# Patient Record
Sex: Female | Born: 1980 | Race: White | Hispanic: No | Marital: Married | Smoking: Never smoker
Health system: Southern US, Community
[De-identification: ages and names within clinical notes are randomized; demographics above are authoritative.]

## PROBLEM LIST (undated history)

## (undated) ENCOUNTER — Emergency Department: Payer: 59

## (undated) DIAGNOSIS — C801 Malignant (primary) neoplasm, unspecified: Secondary | ICD-10-CM

## (undated) DIAGNOSIS — R87619 Unspecified abnormal cytological findings in specimens from cervix uteri: Secondary | ICD-10-CM

## (undated) DIAGNOSIS — D649 Anemia, unspecified: Secondary | ICD-10-CM

## (undated) HISTORY — PX: WISDOM TOOTH EXTRACTION: SHX21

## (undated) HISTORY — DX: Unspecified abnormal cytological findings in specimens from cervix uteri: R87.619

---
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
## 2005-04-26 DIAGNOSIS — R87619 Unspecified abnormal cytological findings in specimens from cervix uteri: Secondary | ICD-10-CM

## 2005-04-26 HISTORY — PX: LEEP: SHX91

## 2005-04-26 HISTORY — DX: Unspecified abnormal cytological findings in specimens from cervix uteri: R87.619

## 2010-07-22 DIAGNOSIS — O359XX0 Maternal care for (suspected) fetal abnormality and damage, unspecified, not applicable or unspecified: Secondary | ICD-10-CM

## 2016-10-05 ENCOUNTER — Ambulatory Visit (INDEPENDENT_AMBULATORY_CARE_PROVIDER_SITE_OTHER): Payer: Commercial Managed Care - HMO | Admitting: Obstetrics & Gynecology

## 2016-10-05 ENCOUNTER — Encounter: Payer: Self-pay | Admitting: Obstetrics & Gynecology

## 2016-10-05 VITALS — BP 107/71 | HR 80 | Ht 63.0 in | Wt 117.0 lb

## 2016-10-05 DIAGNOSIS — Z1151 Encounter for screening for human papillomavirus (HPV): Secondary | ICD-10-CM | POA: Diagnosis not present

## 2016-10-05 DIAGNOSIS — Z124 Encounter for screening for malignant neoplasm of cervix: Secondary | ICD-10-CM

## 2016-10-05 DIAGNOSIS — Z01419 Encounter for gynecological examination (general) (routine) without abnormal findings: Secondary | ICD-10-CM

## 2016-10-05 NOTE — Progress Notes (Signed)
Pt states that she had LEEP procedure in  2007, normal paps since then. Last pap March 2017 with neg HPV.  Pt states she is on cycle now, very light flow.

## 2016-10-05 NOTE — Progress Notes (Signed)
Subjective:    Cindy Bradley is a 36 y.o. MW P3 LC2 (40 and 37 yo boys) female who presents for an annual exam. The patient has no complaints today. The patient is sexually active. GYN screening history: last pap: was normal. The patient wears seatbelts: yes. The patient participates in regular exercise: no. Has the patient ever been transfused or tattooed?: no. The patient reports that there is not domestic violence in her life.   Menstrual History: OB History    Gravida Para Term Preterm AB Living   3 2 2   1 2    SAB TAB Ectopic Multiple Live Births     1     2      Menarche age: 9 Patient's last menstrual period was 10/04/2016.    The following portions of the patient's history were reviewed and updated as appropriate: allergies, current medications, past family history, past medical history, past social history, past surgical history and problem list.  Review of Systems Pertinent items are noted in HPI.   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   Objective:    BP 107/71   Pulse 80   Ht 5\' 3"  (1.6 m)   Wt 117 lb (53.1 kg)   LMP 10/04/2016   BMI 20.73 kg/m   General Appearance:    Alert, cooperative, no distress, appears stated age  Head:    Normocephalic, without obvious abnormality, atraumatic  Eyes:    PERRL, conjunctiva/corneas clear, EOM's intact, fundi    benign, both eyes  Ears:    Normal TM's and external ear canals, both ears  Nose:   Nares normal, septum midline, mucosa normal, no drainage    or sinus tenderness  Throat:   Lips, mucosa, and tongue normal; teeth and gums normal  Neck:   Supple, symmetrical, trachea midline, no adenopathy;    thyroid:  no enlargement/tenderness/nodules; no carotid   bruit or JVD  Back:     Symmetric, no curvature, ROM normal, no CVA tenderness  Lungs:      Clear to auscultation bilaterally, respirations unlabored  Chest Wall:    No tenderness or deformity   Heart:    Regular rate and rhythm, S1 and S2 normal, no murmur, rub   or gallop  Breast Exam:    No tenderness, masses, or nipple abnormality  Abdomen:     Soft, non-tender, bowel sounds active all four quadrants,    no masses, no organomegaly  Genitalia:    Normal female without lesion, discharge or tenderness, NSSA, NT, mobile, no adnexal masses     Extremities:   Extremities normal, atraumatic, no cyanosis or edema  Pulses:   2+ and symmetric all extremities  Skin:   Skin color, texture, turgor normal, no rashes or lesions  Lymph nodes:   Cervical, supraclavicular, and axillary nodes normal  Neurologic:   CNII-XII intact, normal strength, sensation and reflexes    throughout  .    Assessment:    Healthy female exam.   FH of colon cancer   Plan:     Thin prep Pap smear. with cotesting She will call back if she wants to schedule a laparoscopic bilateral salpingectomy.  Rec genetic screening RTC for annual in 1 year (I did discussed ACOG recs)

## 2016-10-07 LAB — CYTOLOGY - PAP
DIAGNOSIS: NEGATIVE
HPV: NOT DETECTED

## 2017-05-10 ENCOUNTER — Ambulatory Visit: Payer: 59 | Admitting: Obstetrics & Gynecology

## 2017-05-10 ENCOUNTER — Encounter: Payer: Self-pay | Admitting: Obstetrics & Gynecology

## 2017-05-10 VITALS — Wt 114.0 lb

## 2017-05-10 DIAGNOSIS — Z30432 Encounter for removal of intrauterine contraceptive device: Secondary | ICD-10-CM

## 2017-05-10 DIAGNOSIS — N938 Other specified abnormal uterine and vaginal bleeding: Secondary | ICD-10-CM

## 2017-05-10 NOTE — Progress Notes (Signed)
Patient ID: Cindy Bradley, female   DOB: [birth year], 37 y.o.   MRN: [id number]  Chief Complaint  Patient presents with  . Vaginal Bleeding    HPI Cindy Bradley is a 37 y.o. female., married, 2 sons, here with irregular bleeding and pain. This started several weeks ago. She has a Paragard IUD and is not happy with the heaviness of her periods. She would like it removed. She would like permanent sterility in the form of a lap BS to help decrease risk of ovarian cancer. She plans to use condoms until then.  She had a CT done at an external site, only showed a little fluid, along with the IUD, in the uterus. HPI  Past Medical History:  Diagnosis Date  . Abnormal Pap smear of cervix 2007    Past Surgical History:  Procedure Laterality Date  . LEEP  2007    History reviewed. No pertinent family history.  Social History Social History   Tobacco Use  . Smoking status: Never Smoker  . Smokeless tobacco: Never Used  Substance Use Topics  . Alcohol use: Yes    Comment: occasional  . Drug use: No    No Known Allergies  Current Outpatient Medications  Medication Sig Dispense Refill  . ciprofloxacin (CIPRO) 500 MG tablet Take by mouth.    . ferrous sulfate 325 (65 FE) MG tablet Take by mouth.     No current facility-administered medications for this visit.     Review of Systems Review of Systems  Weight 114 lb (51.7 kg), last menstrual period 04/17/2017.  Physical Exam Physical Exam  Breathing, conversing, and ambulating normally Well nourished, well hydrated White female, no apparent distress Abd- benign Her IUD was easily removed and noted to be intact.  Data Reviewed   Assessment    Pain and heavy bleeding, normal CT Contraception    Plan    Plans condoms prn, understands failure rate Message sent to [facility] to schedule laparoscopic bilateral salpingectomy Check TSH       Othell Diluzio C Rae Sutcliffe 05/10/2017, 2:32 PM

## 2017-05-10 NOTE — Progress Notes (Signed)
RGYN pt presents for a problem visit today CC: vaginal bleeding  Pt had CT scan last week brought imaging studies. Pt has been having bleeding since IUD insertion 3 yrs ago  ParaGard pt considering having it removed. Pt states  Pain started just last week 9/10  pt states she could not move pain was so bad.   LMP 04/17/17 then again 05/09/17.

## 2017-05-11 LAB — TSH: TSH: 1.94 u[IU]/mL (ref 0.450–4.500)

## 2017-05-19 ENCOUNTER — Encounter: Payer: Self-pay | Admitting: Obstetrics & Gynecology

## 2017-06-28 ENCOUNTER — Encounter (HOSPITAL_COMMUNITY): Payer: Self-pay

## 2017-07-19 ENCOUNTER — Ambulatory Visit: Payer: 59 | Admitting: Obstetrics & Gynecology

## 2017-07-19 ENCOUNTER — Encounter: Payer: Self-pay | Admitting: Obstetrics & Gynecology

## 2017-07-19 VITALS — BP 113/70 | HR 91 | Wt 115.9 lb

## 2017-07-19 DIAGNOSIS — Z3009 Encounter for other general counseling and advice on contraception: Secondary | ICD-10-CM | POA: Diagnosis not present

## 2017-07-19 NOTE — Progress Notes (Signed)
   Subjective:    Patient ID: Cindy Bradley, female    DOB: 1980/06/27, 37 y.o.   MRN: [id number]  HPI 37 yo married P2 here with questions about her scheduled surgery of laparoscopy bilateral salpingectomy. She has been Googling and was worried about damaging the blood supply to the ovary and early menopause.    Review of Systems     Objective:   Physical Exam Breathing, conversing, and ambulating normally Well nourished, well hydrated White female, no apparent distress      Assessment & Plan:  We had a thorough discuss with pictures of the procedure. I have offered to just do Filsche clips but have explained that with careful surgery her ovaries should be fine. Also mentioned vasectomy for husband.

## 2017-08-25 NOTE — Patient Instructions (Addendum)
Your procedure is scheduled on: Wednesday, May 15  Enter through the Main Entrance of [facility] at: 11 am  Pick up the phone at the desk and dial [phone].  Call this number if you have problems the morning of surgery: [phone].  Remember: Do NOT eat food or Do NOT drink clear liquids (including water) after midnight Tuesday.  Take these medicines the morning of surgery with a SIP OF WATER: None  Stop herbal medications, vitamin supplements and ibuprofen at this time.  Do NOT wear jewelry (body piercing), metal hair clips/bobby pins, make-up, or nail polish. Do NOT wear lotions, powders, or perfumes.  You may wear deoderant. Do NOT shave for 48 hours prior to surgery. Do NOT bring valuables to the hospital.   Have a responsible adult drive you home and stay with you for 24 hours after your procedure.  Home with Parents Mr and Mrs Tamala Julian cell [phone].

## 2017-09-02 ENCOUNTER — Other Ambulatory Visit: Payer: Self-pay

## 2017-09-02 ENCOUNTER — Encounter (HOSPITAL_COMMUNITY)
Admission: RE | Admit: 2017-09-02 | Discharge: 2017-09-02 | Disposition: A | Discharge status: Home / Self Care | Payer: 59 | Source: Ambulatory Visit | Attending: Obstetrics & Gynecology | Admitting: Obstetrics & Gynecology

## 2017-09-02 ENCOUNTER — Encounter (HOSPITAL_COMMUNITY): Payer: Self-pay

## 2017-09-02 DIAGNOSIS — Z01812 Encounter for preprocedural laboratory examination: Secondary | ICD-10-CM | POA: Diagnosis not present

## 2017-09-02 HISTORY — DX: Malignant (primary) neoplasm, unspecified: C80.1

## 2017-09-02 HISTORY — DX: Anemia, unspecified: D64.9

## 2017-09-02 LAB — CBC
HCT: 41.7 % (ref 36.0–46.0)
Hemoglobin: 13.6 g/dL (ref 12.0–15.0)
MCH: 29.6 pg (ref 26.0–34.0)
MCHC: 32.6 g/dL (ref 30.0–36.0)
MCV: 90.7 fL (ref 78.0–100.0)
PLATELETS: 230 10*3/uL (ref 150–400)
RBC: 4.6 MIL/uL (ref 3.87–5.11)
RDW: 13.3 % (ref 11.5–15.5)
WBC: 6.6 10*3/uL (ref 4.0–10.5)

## 2017-09-05 NOTE — Anesthesia Preprocedure Evaluation (Addendum)
Anesthesia Evaluation  Patient identified by MRN, date of birth, ID band Patient awake    Reviewed: Allergy & Precautions, NPO status , Patient's Chart, lab work & pertinent test results  Airway Mallampati: I  TM Distance: >3 FB Neck ROM: Full    Dental  (+) Teeth Intact, Dental Advisory Given   Pulmonary neg pulmonary ROS,    Pulmonary exam normal breath sounds clear to auscultation       Cardiovascular Exercise Tolerance: Good negative cardio ROS Normal cardiovascular exam Rhythm:Regular Rate:Normal     Neuro/Psych negative neurological ROS  negative psych ROS   GI/Hepatic negative GI ROS, Neg liver ROS,   Endo/Other  negative endocrine ROS  Renal/GU negative Renal ROS  negative genitourinary   Musculoskeletal negative musculoskeletal ROS (+)   Abdominal   Peds negative pediatric ROS (+)  Hematology  (+) anemia ,   Anesthesia Other Findings   Reproductive/Obstetrics negative OB ROS                            Anesthesia Physical Anesthesia Plan  ASA: I  Anesthesia Plan: General   Post-op Pain Management:    Induction: Intravenous  PONV Risk Score and Plan: 4 or greater and Treatment may vary due to age or medical condition, Scopolamine patch - Pre-op, Ondansetron and Dexamethasone  Airway Management Planned: Oral ETT  Additional Equipment:   Intra-op Plan:   Post-operative Plan: Extubation in OR  Informed Consent: I have reviewed the patients History and Physical, chart, labs and discussed the procedure including the risks, benefits and alternatives for the proposed anesthesia with the patient or authorized representative who has indicated his/her understanding and acceptance.   Dental advisory given  Plan Discussed with: CRNA and Anesthesiologist  Anesthesia Plan Comments:        Anesthesia Quick Evaluation

## 2017-09-07 ENCOUNTER — Encounter (HOSPITAL_COMMUNITY)
Admission: RE | Disposition: A | Discharge status: Home / Self Care | Payer: Self-pay | Source: Ambulatory Visit | Attending: Obstetrics & Gynecology

## 2017-09-07 ENCOUNTER — Ambulatory Visit (HOSPITAL_COMMUNITY)
Admission: RE | Admit: 2017-09-07 | Discharge: 2017-09-07 | Disposition: A | Discharge status: Home / Self Care | Payer: 59 | Source: Ambulatory Visit | Attending: Obstetrics & Gynecology | Admitting: Obstetrics & Gynecology

## 2017-09-07 ENCOUNTER — Other Ambulatory Visit: Payer: Self-pay

## 2017-09-07 ENCOUNTER — Encounter (HOSPITAL_COMMUNITY): Payer: Self-pay | Admitting: Certified Registered Nurse Anesthetist

## 2017-09-07 ENCOUNTER — Ambulatory Visit (HOSPITAL_COMMUNITY): Payer: 59 | Admitting: Anesthesiology

## 2017-09-07 DIAGNOSIS — Z302 Encounter for sterilization: Secondary | ICD-10-CM

## 2017-09-07 DIAGNOSIS — Z85820 Personal history of malignant melanoma of skin: Secondary | ICD-10-CM | POA: Diagnosis not present

## 2017-09-07 HISTORY — PX: LAPAROSCOPIC BILATERAL SALPINGECTOMY: SHX5889

## 2017-09-07 LAB — PREGNANCY, URINE: Preg Test, Ur: NEGATIVE

## 2017-09-07 SURGERY — SALPINGECTOMY, BILATERAL, LAPAROSCOPIC
Anesthesia: General | Laterality: Bilateral

## 2017-09-07 MED ORDER — DEXAMETHASONE SODIUM PHOSPHATE 4 MG/ML IJ SOLN
INTRAMUSCULAR | Status: AC
  Filled 2017-09-07: qty 1

## 2017-09-07 MED ORDER — HYDROMORPHONE HCL 1 MG/ML IJ SOLN: 0.2500 mg | INTRAMUSCULAR | Status: DC | PRN

## 2017-09-07 MED ORDER — ROCURONIUM BROMIDE 100 MG/10ML IV SOLN
INTRAVENOUS | Status: DC | PRN
  Administered 2017-09-07: 30 mg via INTRAVENOUS

## 2017-09-07 MED ORDER — SCOPOLAMINE 1 MG/3DAYS TD PT72
1.0000 | MEDICATED_PATCH | Freq: Once | TRANSDERMAL | Status: DC
  Administered 2017-09-07: 1.5 mg via TRANSDERMAL

## 2017-09-07 MED ORDER — BUPIVACAINE HCL (PF) 0.5 % IJ SOLN
INTRAMUSCULAR | Status: AC
Start: 2017-09-07 — End: ?
  Filled 2017-09-07: qty 30

## 2017-09-07 MED ORDER — PROPOFOL 10 MG/ML IV BOLUS
INTRAVENOUS | Status: AC
  Filled 2017-09-07: qty 20

## 2017-09-07 MED ORDER — SUGAMMADEX SODIUM 200 MG/2ML IV SOLN
INTRAVENOUS | Status: DC | PRN
  Administered 2017-09-07: 100 mg via INTRAVENOUS

## 2017-09-07 MED ORDER — LIDOCAINE HCL (CARDIAC) PF 100 MG/5ML IV SOSY
PREFILLED_SYRINGE | INTRAVENOUS | Status: DC | PRN
  Administered 2017-09-07: 100 mg via INTRAVENOUS

## 2017-09-07 MED ORDER — OXYCODONE-ACETAMINOPHEN 5-325 MG PO TABS: 1.0000 | ORAL_TABLET | ORAL | 0 refills | Status: DC | PRN

## 2017-09-07 MED ORDER — PROPOFOL 10 MG/ML IV BOLUS
INTRAVENOUS | Status: DC | PRN
  Administered 2017-09-07: 170 mg via INTRAVENOUS

## 2017-09-07 MED ORDER — ACETAMINOPHEN 10 MG/ML IV SOLN: 1000.0000 mg | Freq: Once | INTRAVENOUS | Status: DC | PRN

## 2017-09-07 MED ORDER — SCOPOLAMINE 1 MG/3DAYS TD PT72
MEDICATED_PATCH | TRANSDERMAL | Status: AC
  Administered 2017-09-07: 1.5 mg via TRANSDERMAL
  Filled 2017-09-07: qty 1

## 2017-09-07 MED ORDER — KETOROLAC TROMETHAMINE 30 MG/ML IJ SOLN
INTRAMUSCULAR | Status: AC
  Filled 2017-09-07: qty 1

## 2017-09-07 MED ORDER — LIDOCAINE HCL (PF) 1 % IJ SOLN
INTRAMUSCULAR | Status: AC
  Filled 2017-09-07: qty 5

## 2017-09-07 MED ORDER — SUGAMMADEX SODIUM 200 MG/2ML IV SOLN
INTRAVENOUS | Status: AC
  Filled 2017-09-07: qty 2

## 2017-09-07 MED ORDER — MIDAZOLAM HCL 2 MG/2ML IJ SOLN
INTRAMUSCULAR | Status: AC
  Filled 2017-09-07: qty 2

## 2017-09-07 MED ORDER — MIDAZOLAM HCL 2 MG/2ML IJ SOLN
INTRAMUSCULAR | Status: DC | PRN
  Administered 2017-09-07: 2 mg via INTRAVENOUS

## 2017-09-07 MED ORDER — IBUPROFEN 600 MG PO TABS: 600.0000 mg | ORAL_TABLET | Freq: Four times a day (QID) | ORAL | 1 refills | Status: DC | PRN

## 2017-09-07 MED ORDER — LACTATED RINGERS IV SOLN
INTRAVENOUS | Status: DC
  Administered 2017-09-07: 125 mL/h via INTRAVENOUS

## 2017-09-07 MED ORDER — FENTANYL CITRATE (PF) 250 MCG/5ML IJ SOLN
INTRAMUSCULAR | Status: AC
  Filled 2017-09-07: qty 5

## 2017-09-07 MED ORDER — ONDANSETRON HCL 4 MG/2ML IJ SOLN
INTRAMUSCULAR | Status: AC
  Filled 2017-09-07: qty 2

## 2017-09-07 MED ORDER — MEPERIDINE HCL 25 MG/ML IJ SOLN: 6.2500 mg | INTRAMUSCULAR | Status: DC | PRN

## 2017-09-07 MED ORDER — BUPIVACAINE HCL (PF) 0.5 % IJ SOLN
INTRAMUSCULAR | Status: DC | PRN
  Administered 2017-09-07: 20 mL

## 2017-09-07 MED ORDER — FENTANYL CITRATE (PF) 100 MCG/2ML IJ SOLN
INTRAMUSCULAR | Status: DC | PRN
  Administered 2017-09-07: 100 ug via INTRAVENOUS

## 2017-09-07 MED ORDER — KETOROLAC TROMETHAMINE 30 MG/ML IJ SOLN
INTRAMUSCULAR | Status: DC | PRN
  Administered 2017-09-07: 30 mg via INTRAVENOUS

## 2017-09-07 MED ORDER — HYDROCODONE-ACETAMINOPHEN 7.5-325 MG PO TABS: 1.0000 | ORAL_TABLET | Freq: Once | ORAL | Status: DC | PRN

## 2017-09-07 MED ORDER — PROMETHAZINE HCL 25 MG/ML IJ SOLN: 6.2500 mg | INTRAMUSCULAR | Status: DC | PRN

## 2017-09-07 MED ORDER — ONDANSETRON HCL 4 MG/2ML IJ SOLN
INTRAMUSCULAR | Status: DC | PRN
  Administered 2017-09-07: 4 mg via INTRAVENOUS

## 2017-09-07 SURGICAL SUPPLY — 26 items
DRSG OPSITE POSTOP 3X4 (GAUZE/BANDAGES/DRESSINGS) IMPLANT
DURAPREP 26ML APPLICATOR (WOUND CARE) ×3 IMPLANT
GLOVE BIO SURGEON STRL SZ 6.5 (GLOVE) ×4 IMPLANT
GLOVE BIO SURGEONS STRL SZ 6.5 (GLOVE) ×2
GLOVE BIOGEL PI IND STRL 7.0 (GLOVE) ×2 IMPLANT
GLOVE BIOGEL PI INDICATOR 7.0 (GLOVE) ×4
GOWN STRL REUS W/TWL LRG LVL3 (GOWN DISPOSABLE) ×6 IMPLANT
NDL SAFETY ECLIPSE 18X1.5 (NEEDLE) ×1 IMPLANT
NEEDLE HYPO 18GX1.5 SHARP (NEEDLE) ×2
NEEDLE INSUFFLATION 120MM (ENDOMECHANICALS) ×3 IMPLANT
NS IRRIG 1000ML POUR BTL (IV SOLUTION) ×3 IMPLANT
PACK LAPAROSCOPY BASIN (CUSTOM PROCEDURE TRAY) ×3 IMPLANT
PACK TRENDGUARD 450 HYBRID PRO (MISCELLANEOUS) ×1 IMPLANT
PROTECTOR NERVE ULNAR (MISCELLANEOUS) ×6 IMPLANT
SHEARS HARMONIC ACE PLUS 36CM (ENDOMECHANICALS) ×3 IMPLANT
SLEEVE XCEL OPT CAN 5 100 (ENDOMECHANICALS) ×6 IMPLANT
SUT VIC AB 2-0 CT2 27 (SUTURE) ×3 IMPLANT
SUT VIC AB 4-0 PS2 27 (SUTURE) ×3 IMPLANT
SUT VICRYL 0 UR6 27IN ABS (SUTURE) IMPLANT
SUT VICRYL 4-0 PS2 18IN ABS (SUTURE) ×3 IMPLANT
SYR 30ML LL (SYRINGE) ×3 IMPLANT
TOWEL OR 17X24 6PK STRL BLUE (TOWEL DISPOSABLE) ×6 IMPLANT
TRENDGUARD 450 HYBRID PRO PACK (MISCELLANEOUS) ×3
TROCAR OPTI TIP 5M 100M (ENDOMECHANICALS) ×3 IMPLANT
TROCAR XCEL DIL TIP R 11M (ENDOMECHANICALS) ×3 IMPLANT
WARMER LAPAROSCOPE (MISCELLANEOUS) ×3 IMPLANT

## 2017-09-07 NOTE — Anesthesia Procedure Notes (Signed)
Procedure Name: Intubation Date/Time: 09/07/2017 12:51 PM Performed by: Bufford Spikes, CRNA Pre-anesthesia Checklist: Patient identified, Emergency Drugs available, Suction available, Patient being monitored and Timeout performed Patient Re-evaluated:Patient Re-evaluated prior to induction Oxygen Delivery Method: Circle system utilized Preoxygenation: Pre-oxygenation with 100% oxygen Induction Type: IV induction Ventilation: Mask ventilation without difficulty Laryngoscope Size: Mac and 3 Grade View: Grade I Tube type: Oral Tube size: 6.5 mm Number of attempts: 1 Airway Equipment and Method: Stylet Placement Confirmation: ETT inserted through vocal cords under direct vision,  positive ETCO2 and breath sounds checked- equal and bilateral Secured at: 21 cm Tube secured with: Tape Dental Injury: Teeth and Oropharynx as per pre-operative assessment

## 2017-09-07 NOTE — Anesthesia Postprocedure Evaluation (Signed)
Anesthesia Post Note  Patient: Cindy Bradley  Procedure(s) Performed: LAPAROSCOPIC BILATERAL SALPINGECTOMY (Bilateral )     Patient location during evaluation: PACU Anesthesia Type: General Level of consciousness: awake and alert Pain management: pain level controlled Vital Signs Assessment: post-procedure vital signs reviewed and stable Respiratory status: spontaneous breathing, nonlabored ventilation, respiratory function stable and patient connected to nasal cannula oxygen Cardiovascular status: blood pressure returned to baseline and stable Postop Assessment: no apparent nausea or vomiting Anesthetic complications: no    Last Vitals:  Vitals:   09/07/17 1416 09/07/17 1430  BP: 106/62 104/62  Pulse: 69 72  Resp: 13 (!) 21  Temp:  36.4 C  SpO2: 100% 100%    Last Pain:  Vitals:   09/07/17 1416  TempSrc:   PainSc: 0-No pain   Pain Goal: Patients Stated Pain Goal: 3 (09/07/17 1106)               Barnet Glasgow

## 2017-09-07 NOTE — Discharge Instructions (Signed)

## 2017-09-07 NOTE — Op Note (Signed)
09/07/2017  1:35 PM  PATIENT:  Cindy Bradley  37 y.o. female  PRE-OPERATIVE DIAGNOSIS:  Undesired Fertility  POST-OPERATIVE DIAGNOSIS:  Undesired Fertility  PROCEDURE:  Procedure(s): LAPAROSCOPIC BILATERAL SALPINGECTOMY (Bilateral), partial  SURGEON:  Surgeon(s) and Role:    * Bertie Simien C, MD - Primary  PHYSICIAN ASSISTANT:   ASSISTANTS: none   ANESTHESIA:   Local and general  EBL:  10 mL   BLOOD ADMINISTERED:none  DRAINS: none   LOCAL MEDICATIONS USED:  MARCAINE     SPECIMEN:  Source of Specimen:  fimbriated ends of both oviducts  DISPOSITION OF SPECIMEN:  PATHOLOGY  COUNTS:  YES  TOURNIQUET:  * No tourniquets in log *  DICTATION: .Dragon Dictation  PLAN OF CARE: Discharge to home after PACU  PATIENT DISPOSITION:  PACU - hemodynamically stable.   Delay start of Pharmacological VTE agent (>24hrs) due to surgical blood loss or risk of bleeding: not applicable  The risks, benefits, and alternatives of surgery were explained, understood, accepted. She is certain that she wants permanent sterility. She understands that this is not reversible. She understands there is a small failure rate of this procedure. She also would like to have both oviducts removed her due newest recommendations to help prevent ovarian/peritoneal cancer. She is worried about the damaging the blood supply to the ovaries so I have agreed to only remove the fimbriated end of each oviduct.  In the operating room she was placed in the dorsal lithotomy position, and general anesthesia was given without complication. Her abdomen and vagina were prepped and draped in the usual sterile fashion. A timeout procedure was done. A bimanual exam revealed a small anteverted and mobile uterus. Her adnexa felt normal. A Hulka manipulator was placed. Gloves were changed, and attention was turned to the abdomen. Approximately the 5 mL of 0.5% Marcaine was injected into the umbilicus. A vertical incision was made at the  site. A varies needle was placed intraperitoneally. Low-flow CO2 was used to insufflate the abdomen to approximately 3-1/2 L. Once a good pneumoperitoneum was established, a 5 mm Excel trocar was placed. Laparoscopy confirmed correct placement. A 5 mm port was placed in the left lower quadrant and a 5 mm port in the right lower quadrant under direct laparoscopic visualization after injecting 0.5% Marcaine in the incision sites. Her pelvis and upper abdomen appeared normal. A Harmonic scapel was used to hemostatically remove the fimbriated ends of both oviducts. I removed the oviducts through the 10 mm port. I removed the 5 mm ports and noted hemostasis.  A subcuticular closure was done with 4-0 Vicryl suture at all incision sites. A Steri-Strip was placed across each incision. There was some bleeding from the tenaculum site on her cervix, so I placed a figure of eight 3-0 vicryl suture there and hemostasis was achieved.  She was extubated and taken to the recovery room in stable condition.

## 2017-09-07 NOTE — H&P (Signed)
Cindy Bradley is an 37 y.o. female. Married P2 here for laparoscopic bilateral partial salpingectomy for permanent sterility. She and her husband are certain that they don't want any more kids. They decline alternative forms of contraception.   Patient's last menstrual period was 08/31/2017 (exact date).    Past Medical History:  Diagnosis Date  . Abnormal Pap smear of cervix 2007  . Anemia   . 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 SVD (spontaneous vaginal delivery)    x 1 at 23 wks - late term termination in [place]    Past Surgical History:  Procedure Laterality Date  . CESAREAN SECTION  09/05/2011  . CESAREAN SECTION  09/14/2013  . LEEP  2007  . WISDOM TOOTH EXTRACTION      Family History  Problem Relation Age of Onset  . Colon cancer Father   . Colon cancer Paternal Grandmother     Social History:  reports that she has never smoked. She has never used smokeless tobacco. She reports that she drinks alcohol. She reports that she does not use drugs.  Allergies:  Allergies  Allergen Reactions  . Latex Itching    Latex condoms     Medications Prior to Admission  Medication Sig Dispense Refill Last Dose  . ferrous sulfate 325 (65 FE) MG tablet Take 325 mg by mouth daily.    09/06/2017 at Unknown time    ROS  Pap smear normal 2018  Blood pressure 110/73, pulse 80, temperature 98 F (36.7 C), temperature source Oral, resp. rate 16, last menstrual period 08/31/2017, SpO2 100 %. Physical Exam Breathing, conversing, and ambulating normally Well nourished, well hydrated White female, no apparent distress Heart- rrr Lungs- CTAB Abd- benign, scar from 2 previous cesarean sections  Results for orders placed or performed during the hospital encounter of 09/07/17 (from the past 24 hour(s))  Pregnancy, urine     Status: None   Collection Time: 09/07/17 11:00 AM  Result Value Ref Range   Preg Test, Ur NEGATIVE NEGATIVE    No results  found.  Assessment/Plan: Unwanted fertility- plan for laparoscopic partial salpingectomy.  She understands the risks of surgery, including, but not to infection, bleeding, DVTs, damage to bowel, bladder, ureters. She wishes to proceed. She is aware of the permanence of this procedure as well as the failure rate.  Emily Filbert 09/07/2017, 12:03 PM

## 2017-09-07 NOTE — Transfer of Care (Signed)
Immediate Anesthesia Transfer of Care Note  Patient: Cindy Bradley  Procedure(s) Performed: LAPAROSCOPIC BILATERAL SALPINGECTOMY (Bilateral )  Patient Location: PACU  Anesthesia Type:General  Level of Consciousness: awake, alert  and oriented  Airway & Oxygen Therapy: Patient Spontanous Breathing and Patient connected to nasal cannula oxygen  Post-op Assessment: Report given to RN and Post -op Vital signs reviewed and stable  Post vital signs: Reviewed and stable  Last Vitals:  Vitals Value Taken Time  BP    Temp    Pulse    Resp    SpO2      Last Pain:  Vitals:   09/07/17 1106  TempSrc: Oral  PainSc: 6       Patients Stated Pain Goal: 3 (71/25/27 1292)  Complications: No apparent anesthesia complications

## 2017-09-08 ENCOUNTER — Telehealth: Payer: Self-pay

## 2017-09-08 ENCOUNTER — Other Ambulatory Visit: Payer: Self-pay

## 2017-09-08 ENCOUNTER — Inpatient Hospital Stay (HOSPITAL_COMMUNITY)
Admission: AD | Admit: 2017-09-08 | Discharge: 2017-09-08 | Disposition: A | Discharge status: Home / Self Care | Payer: 59 | Source: Ambulatory Visit | Attending: Obstetrics and Gynecology | Admitting: Obstetrics and Gynecology

## 2017-09-08 ENCOUNTER — Encounter (HOSPITAL_COMMUNITY): Payer: Self-pay | Admitting: Obstetrics & Gynecology

## 2017-09-08 DIAGNOSIS — Z9079 Acquired absence of other genital organ(s): Secondary | ICD-10-CM

## 2017-09-08 DIAGNOSIS — R42 Dizziness and giddiness: Secondary | ICD-10-CM | POA: Diagnosis present

## 2017-09-08 DIAGNOSIS — Z79899 Other long term (current) drug therapy: Secondary | ICD-10-CM | POA: Insufficient documentation

## 2017-09-08 DIAGNOSIS — G8918 Other acute postprocedural pain: Secondary | ICD-10-CM | POA: Diagnosis not present

## 2017-09-08 LAB — URINALYSIS, ROUTINE W REFLEX MICROSCOPIC
Bilirubin Urine: NEGATIVE
GLUCOSE, UA: NEGATIVE mg/dL
Ketones, ur: NEGATIVE mg/dL
Leukocytes, UA: NEGATIVE
Nitrite: NEGATIVE
Protein, ur: NEGATIVE mg/dL
Specific Gravity, Urine: 1.015 (ref 1.005–1.030)
pH: 6 (ref 5.0–8.0)

## 2017-09-08 LAB — CBC
HCT: 38 % (ref 36.0–46.0)
HEMOGLOBIN: 12.5 g/dL (ref 12.0–15.0)
MCH: 29.6 pg (ref 26.0–34.0)
MCHC: 32.9 g/dL (ref 30.0–36.0)
MCV: 89.8 fL (ref 78.0–100.0)
Platelets: 231 10*3/uL (ref 150–400)
RBC: 4.23 MIL/uL (ref 3.87–5.11)
RDW: 13.4 % (ref 11.5–15.5)
WBC: 8.5 10*3/uL (ref 4.0–10.5)

## 2017-09-08 NOTE — Telephone Encounter (Signed)
Pt called complaining of having some dizziness and feeling lightheaded. Pt also complains of having some vaginal bleeding. Advised pt that some vaginal bleeding can come from having the surgery, but to be evaluated for the dizziness and lightheadedness to rule out other issues.

## 2017-09-08 NOTE — MAU Note (Signed)
Pt had salpingectomy yesterday. Feeling dizzy and lightheaded today. Had taken ibuprofen and removed her nausea patch patch earlier today. Also reports ome spotting

## 2017-09-08 NOTE — MAU Provider Note (Signed)
History     CSN: [id number]  Arrival date and time: 09/08/17 1630   First Provider Initiated Contact with Patient 09/08/17 1900      Chief Complaint  Patient presents with  . Post-op Problem   HPI  Ms.  Cindy Bradley is a 37 y.o. year old G70P2012 non-pregnant female who is s/p laparoscopic bilateral salpingectomy on 09/07/2017 presents to MAU reporting a "few seconds of light-headedness and difficulty focusing on close things (reading)", small amount of VB; not needing to change the pad. She states she removed her Scopolamine patch this morning. She has been able to eat and drink normally today. She has only been taking Ibuprofen for pain control; last dose was 1030 today. She called Dr. Alease Medina RN and was told that small VB is normal for the surgery she had, but she would need to have the light-headedness and dizziness evaluated here. She is concerned she may have "internal bleeding" and worried about "feeling woozy".  Past Medical History:  Diagnosis Date  . Abnormal Pap smear of cervix 2007  . Anemia   . 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 SVD (spontaneous vaginal delivery)    x 1 at 23 wks - late term termination in [place]    Past Surgical History:  Procedure Laterality Date  . CESAREAN SECTION  09/05/2011  . CESAREAN SECTION  09/14/2013  . LAPAROSCOPIC BILATERAL SALPINGECTOMY Bilateral 09/07/2017   Procedure: LAPAROSCOPIC BILATERAL SALPINGECTOMY;  Surgeon: Emily Filbert, MD;  Location: [facility];  Service: Gynecology;  Laterality: Bilateral;  . LEEP  2007  . WISDOM TOOTH EXTRACTION      Family History  Problem Relation Age of Onset  . Colon cancer Father   . Colon cancer Paternal Grandmother     Social History   Tobacco Use  . Smoking status: Never Smoker  . Smokeless tobacco: Never Used  Substance Use Topics  . Alcohol use: Yes    Comment: occasional  . Drug use: No    Allergies:  Allergies  Allergen Reactions  . Latex Itching    Latex condoms      Medications Prior to Admission  Medication Sig Dispense Refill Last Dose  . ibuprofen (ADVIL,MOTRIN) 600 MG tablet Take 1 tablet (600 mg total) by mouth every 6 (six) hours as needed. 30 tablet 1   . oxyCODONE-acetaminophen (PERCOCET/ROXICET) 5-325 MG tablet Take 1 tablet by mouth every 4 (four) hours as needed. 10 tablet 0     Review of Systems  Constitutional: Negative.   HENT: Negative.   Eyes: Negative.   Respiratory: Negative.   Cardiovascular: Negative.   Gastrointestinal: Positive for abdominal pain (mild tenderness over surgical sites). Negative for nausea and vomiting.  Endocrine: Negative.   Genitourinary: Positive for pelvic pain (tenderness from surgery).  Musculoskeletal: Negative.   Skin: Negative.   Allergic/Immunologic: Negative.   Neurological: Positive for dizziness and light-headedness.  Hematological: Negative.   Psychiatric/Behavioral: Negative.    Physical Exam   Blood pressure 113/71, pulse 71, temperature 98.5 F (36.9 C), resp. rate 18, height 5\' 3"  (1.6 m), weight 115 lb (52.2 kg), last menstrual period 08/31/2017.  Physical Exam  Nursing note and vitals reviewed. Constitutional: She is oriented to person, place, and time. She appears well-developed and well-nourished.  HENT:  Head: Normocephalic and atraumatic.  Eyes: Pupils are equal, round, and reactive to light.  Neck: Normal range of motion.  Cardiovascular: Normal rate, regular rhythm and normal heart sounds.  Respiratory: Effort normal and  breath sounds normal.  GI: Soft. Bowel sounds are normal. There is tenderness (mild; normal postoperative pain).  Genitourinary:  Genitourinary Comments: Pelvic deferred  Musculoskeletal: Normal range of motion.  Neurological: She is alert and oriented to person, place, and time.  Skin: Skin is warm and dry.  Psychiatric: She has a normal mood and affect. Her behavior is normal. Judgment and thought content normal.    MAU Course   Procedures  MDM CBC  Results for orders placed or performed during the hospital encounter of 09/08/17 (from the past 24 hour(s))  Urinalysis, Routine w reflex microscopic     Status: Abnormal   Collection Time: 09/08/17  5:10 PM  Result Value Ref Range   Color, Urine YELLOW YELLOW   APPearance CLEAR CLEAR   Specific Gravity, Urine 1.015 1.005 - 1.030   pH 6.0 5.0 - 8.0   Glucose, UA NEGATIVE NEGATIVE mg/dL   Hgb urine dipstick LARGE (A) NEGATIVE   Bilirubin Urine NEGATIVE NEGATIVE   Ketones, ur NEGATIVE NEGATIVE mg/dL   Protein, ur NEGATIVE NEGATIVE mg/dL   Nitrite NEGATIVE NEGATIVE   Leukocytes, UA NEGATIVE NEGATIVE   RBC / HPF 0-5 0 - 5 RBC/hpf   WBC, UA 0-5 0 - 5 WBC/hpf   Bacteria, UA RARE (A) NONE SEEN   Squamous Epithelial / LPF 0-5 0 - 5   Mucus PRESENT   CBC     Status: None   Collection Time: 09/08/17  7:36 PM  Result Value Ref Range   WBC 8.5 4.0 - 10.5 K/uL   RBC 4.23 3.87 - 5.11 MIL/uL   Hemoglobin 12.5 12.0 - 15.0 g/dL   HCT 38.0 36.0 - 46.0 %   MCV 89.8 78.0 - 100.0 fL   MCH 29.6 26.0 - 34.0 pg   MCHC 32.9 30.0 - 36.0 g/dL   RDW 13.4 11.5 - 15.5 %   Platelets 231 150 - 400 K/uL    Assessment and Plan  Post-operative pain  - Continue Ibuprofen 600 mg every 6 hrs x 48 hrs - Keep scheduled appt with Dr. Hulan Fray on 5/22 - Bleeding precautions and reasons for returning to MAU reviewed - Information provided on post-op pain relief & pain medication instructions - Discharge patient - Patient verbalized an understanding of the plan of care and agrees.     Laury Deep, MSN, CNM 09/08/2017, 7:14 PM

## 2017-09-08 NOTE — Discharge Instructions (Signed)
PLEASE TAKE YOUR IBUPROFEN 600 MG EVERY 6 HOURS FOR AT LEAST 48 HOURS.

## 2017-09-08 NOTE — MAU Note (Addendum)
Had out-pt surgery yesterday and felt fine post-op.  Today has felt light headed and it takes a few seconds for her eyes to focus on close things (reading).  Is having a small amount of vaginal bleeding, but has not needed to change pads.  Removed scopalamine patch this morning. Has eaten normally today and has been drinking normally.  Taking only ibuprofen for pain control.  Worried about feeling "woozy" and worried about internal bleeding.

## 2017-09-14 ENCOUNTER — Encounter: Payer: Self-pay | Admitting: Obstetrics & Gynecology

## 2017-09-14 ENCOUNTER — Ambulatory Visit: Payer: 59 | Admitting: Obstetrics & Gynecology

## 2017-09-14 VITALS — BP 110/71 | HR 77 | Wt 113.0 lb

## 2017-09-14 DIAGNOSIS — Z9889 Other specified postprocedural states: Secondary | ICD-10-CM

## 2017-09-14 NOTE — Progress Notes (Signed)
   Subjective:    Patient ID: Cindy Bradley, female    DOB: [birth year], 37 y.o.   MRN: [id number]  HPI 37 yo married P2 here for a 1 week postop visit after having a laparoscopic bilateral salpingectomy for unwanted fertililty. She has no problems. She had dizziness on POD #1 and had a reassuring evaluation at the [facility].   Review of Systems Pap smear normal 6/18    Objective:   Physical Exam Breathing, conversing, and ambulating normally Well nourished, well hydrated White female, no apparent distress Incisions healed well Abd- benign     Assessment & Plan:  Post op - doing well Rec no unprotected sex until after NMP

## 2018-11-09 ENCOUNTER — Ambulatory Visit (INDEPENDENT_AMBULATORY_CARE_PROVIDER_SITE_OTHER): Payer: 59

## 2018-11-09 ENCOUNTER — Other Ambulatory Visit: Payer: Self-pay

## 2018-11-09 ENCOUNTER — Encounter: Payer: Self-pay | Admitting: Obstetrics & Gynecology

## 2018-11-09 ENCOUNTER — Ambulatory Visit (INDEPENDENT_AMBULATORY_CARE_PROVIDER_SITE_OTHER): Payer: 59 | Admitting: Obstetrics & Gynecology

## 2018-11-09 VITALS — BP 119/75 | HR 89 | Ht 63.0 in | Wt 117.0 lb

## 2018-11-09 DIAGNOSIS — Z124 Encounter for screening for malignant neoplasm of cervix: Secondary | ICD-10-CM

## 2018-11-09 DIAGNOSIS — Z1151 Encounter for screening for human papillomavirus (HPV): Secondary | ICD-10-CM

## 2018-11-09 DIAGNOSIS — N923 Ovulation bleeding: Secondary | ICD-10-CM

## 2018-11-09 DIAGNOSIS — Z01419 Encounter for gynecological examination (general) (routine) without abnormal findings: Secondary | ICD-10-CM

## 2018-11-09 DIAGNOSIS — Z113 Encounter for screening for infections with a predominantly sexual mode of transmission: Secondary | ICD-10-CM

## 2018-11-09 NOTE — Progress Notes (Signed)
Subjective:    Cindy Bradley is a 38 y.o. married P2 (25 and 7 sons) female who presents for an annual exam. She has some light IMB for the last year or so.  The patient is sexually active. GYN screening history: last pap: was normal. The patient wears seatbelts: yes. The patient participates in regular exercise: yes. Has the patient ever been transfused or tattooed?: no. The patient reports that there is not domestic violence in her life.   Menstrual History: OB History    Gravida  3   Para  2   Term  2   Preterm      AB  1   Living  2     SAB      TAB  1   Ectopic      Multiple      Live Births  2           Menarche age: 53 Patient's last menstrual period was 10/17/2018.    The following portions of the patient's history were reviewed and updated as appropriate: allergies, current medications, past family history, past medical history, past social history, past surgical history and problem list.  Review of Systems Pertinent items are noted in HPI.   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  Objective:    BP 119/75   Pulse 89   Ht 5\' 3"  (1.6 m)   Wt 117 lb (53.1 kg)   LMP 10/17/2018   BMI 20.73 kg/m   General Appearance:    Alert, cooperative, no distress, appears stated age  Head:    Normocephalic, without obvious abnormality, atraumatic  Eyes:    PERRL, conjunctiva/corneas clear, EOM's intact, fundi    benign, both eyes  Ears:    Normal TM's and external ear canals, both ears  Nose:   Nares normal, septum midline, mucosa normal, no drainage    or sinus tenderness  Throat:   Lips, mucosa, and tongue normal; teeth and gums normal  Neck:   Supple, symmetrical, trachea midline, no adenopathy;    thyroid:  no enlargement/tenderness/nodules; no carotid   bruit or JVD  Back:     Symmetric, no curvature, ROM normal, no CVA tenderness   Lungs:     Clear to auscultation bilaterally, respirations unlabored  Chest Wall:    No tenderness or deformity   Heart:    Regular rate and rhythm, S1 and S2 normal, no murmur, rub   or gallop  Breast Exam:    No tenderness, masses, or nipple abnormality  Abdomen:     Soft, non-tender, bowel sounds active all four quadrants,    no masses, no organomegaly  Genitalia:    Normal female without lesion, discharge or tenderness, normal size and shape, retroverted, mobile, non-tender, normal adnexal exam      Extremities:   Extremities normal, atraumatic, no cyanosis or edema  Pulses:   2+ and symmetric all extremities  Skin:   Skin color, texture, turgor normal, no rashes or lesions  Lymph nodes:   Cervical, supraclavicular, and axillary nodes normal  Neurologic:   CNII-XII intact, normal strength, sensation and reflexes    throughout  .    Assessment:    Healthy female exam.   IMB   Plan:     Thin prep Pap smear. with cotesting Add GC/CT Check tsh Gyn u/s

## 2018-11-09 NOTE — Progress Notes (Signed)
Last pap- 10/05/16- negative

## 2018-11-10 LAB — TSH: TSH: 1.03 mIU/L

## 2018-11-14 LAB — CYTOLOGY - PAP
Adequacy: ABSENT
Chlamydia: NEGATIVE
Diagnosis: NEGATIVE
HPV: NOT DETECTED
Neisseria Gonorrhea: NEGATIVE

## 2018-11-21 ENCOUNTER — Other Ambulatory Visit (INDEPENDENT_AMBULATORY_CARE_PROVIDER_SITE_OTHER): Payer: 59 | Admitting: *Deleted

## 2018-11-21 ENCOUNTER — Other Ambulatory Visit: Payer: Self-pay

## 2018-11-21 DIAGNOSIS — Z809 Family history of malignant neoplasm, unspecified: Secondary | ICD-10-CM

## 2018-11-21 NOTE — Progress Notes (Signed)
Pt here for [facility] genetics only

## 2018-12-11 ENCOUNTER — Encounter: Payer: Self-pay | Admitting: *Deleted

## 2018-12-12 ENCOUNTER — Encounter: Payer: Self-pay | Admitting: *Deleted

## 2018-12-28 ENCOUNTER — Encounter: Payer: Self-pay | Admitting: Obstetrics & Gynecology

## 2018-12-28 ENCOUNTER — Other Ambulatory Visit: Payer: Self-pay

## 2018-12-28 ENCOUNTER — Ambulatory Visit: Payer: 59 | Admitting: Obstetrics & Gynecology

## 2018-12-28 VITALS — BP 108/63 | HR 87 | Resp 16 | Ht 63.0 in | Wt 117.0 lb

## 2018-12-28 DIAGNOSIS — Z113 Encounter for screening for infections with a predominantly sexual mode of transmission: Secondary | ICD-10-CM

## 2018-12-28 DIAGNOSIS — N898 Other specified noninflammatory disorders of vagina: Secondary | ICD-10-CM | POA: Diagnosis not present

## 2018-12-28 DIAGNOSIS — Z23 Encounter for immunization: Secondary | ICD-10-CM

## 2018-12-28 NOTE — Progress Notes (Signed)
   Subjective:    Patient ID: Cindy Bradley, female    DOB: 1980-10-25, 38 y.o.   MRN: [id number]  HPI 38 yo married P2 here to have a wet prep. She has some light brown discharge monthly between periods. Her u/s was normal, 13 mm enodmetrium. TSH was normal.   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    Objective:   Physical Exam Breathing, conversing, and ambulating normally Well nourished, well hydrated White female, no apparent distress Spec exam- essentially normal vaginal discharge, brown tint noted     Assessment & Plan:  Spotting between periods- reassurance given - she declines OCPs to help regulate periods. Wet prep sent Flu vaccine today Fasting labs at her next visit

## 2018-12-30 LAB — CERVICOVAGINAL ANCILLARY ONLY
Bacterial vaginitis: NEGATIVE
Candida vaginitis: NEGATIVE
Chlamydia: NEGATIVE
Neisseria Gonorrhea: NEGATIVE

## 2019-11-12 ENCOUNTER — Encounter: Payer: Self-pay | Admitting: Obstetrics and Gynecology

## 2019-11-12 ENCOUNTER — Ambulatory Visit (INDEPENDENT_AMBULATORY_CARE_PROVIDER_SITE_OTHER): Payer: 59 | Admitting: Obstetrics and Gynecology

## 2019-11-12 ENCOUNTER — Other Ambulatory Visit (HOSPITAL_COMMUNITY)
Admission: RE | Admit: 2019-11-12 | Discharge: 2019-11-12 | Disposition: A | Discharge status: Home / Self Care | Payer: 59 | Source: Ambulatory Visit | Attending: Obstetrics and Gynecology | Admitting: Obstetrics and Gynecology

## 2019-11-12 ENCOUNTER — Other Ambulatory Visit: Payer: Self-pay | Admitting: Obstetrics and Gynecology

## 2019-11-12 ENCOUNTER — Other Ambulatory Visit: Payer: Self-pay

## 2019-11-12 VITALS — BP 116/75 | HR 77 | Ht 63.0 in | Wt 120.0 lb

## 2019-11-12 DIAGNOSIS — Z3202 Encounter for pregnancy test, result negative: Secondary | ICD-10-CM

## 2019-11-12 DIAGNOSIS — Z01419 Encounter for gynecological examination (general) (routine) without abnormal findings: Secondary | ICD-10-CM | POA: Diagnosis present

## 2019-11-12 DIAGNOSIS — Z124 Encounter for screening for malignant neoplasm of cervix: Secondary | ICD-10-CM

## 2019-11-12 DIAGNOSIS — Z800 Family history of malignant neoplasm of digestive organs: Secondary | ICD-10-CM

## 2019-11-12 DIAGNOSIS — Z1231 Encounter for screening mammogram for malignant neoplasm of breast: Secondary | ICD-10-CM

## 2019-11-12 DIAGNOSIS — N939 Abnormal uterine and vaginal bleeding, unspecified: Secondary | ICD-10-CM | POA: Diagnosis not present

## 2019-11-12 LAB — POCT URINE PREGNANCY: Preg Test, Ur: NEGATIVE

## 2019-11-12 NOTE — Progress Notes (Signed)
Pt c/o spotting in between periods

## 2019-11-12 NOTE — Progress Notes (Signed)
GYNECOLOGY ANNUAL PREVENTATIVE CARE ENCOUNTER NOTE  Subjective:   Cindy Bradley is a 39 y.o. G36P2012 female here for a annual gynecologic exam. Current complaints: spotting in between periods, dark brown spotting about a week after her periods. Otherwise doing well, requests pap due to h/o LEEP. Declines STI screen.    Denies discharge, pelvic pain, problems with intercourse or other gynecologic concerns.    Gynecologic History Patient's last menstrual period was 10/29/2019. Contraception: tubal ligation Last Pap: 10/2018. 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   SVD (spontaneous vaginal delivery)    x 1 at 23 wks - late term termination in [place]    Past Surgical History:  Procedure Laterality Date   CESAREAN SECTION  09/05/2011   CESAREAN SECTION  09/14/2013   LAPAROSCOPIC BILATERAL SALPINGECTOMY Bilateral 09/07/2017   Procedure: LAPAROSCOPIC BILATERAL SALPINGECTOMY;  Surgeon: Emily Filbert, MD;  Location: [facility];  Service: Gynecology;  Laterality: Bilateral;   LEEP  2007   WISDOM TOOTH EXTRACTION      Current Outpatient Medications on File Prior to Visit  Medication Sig Dispense Refill   ibuprofen (ADVIL,MOTRIN) 600 MG tablet Take 1 tablet (600 mg total) by mouth every 6 (six) hours as needed. (Patient not taking: Reported on 09/14/2017) 30 tablet 1   No current facility-administered medications on file prior to visit.    Allergies  Allergen  Reactions   Latex Itching    Latex condoms     Social History   Socioeconomic History   Marital status: Married    Spouse name: Not on file   Number of children: Not on file   Years of education: Not on file   Highest education level: Not on file  Occupational History   Not on file  Tobacco Use   Smoking status: Never Smoker   Smokeless tobacco: Never Used  Vaping Use   Vaping Use: Never used  Substance and Sexual Activity   Alcohol use: Yes    Comment: occasional   Drug use: No   Sexual activity: Yes    Partners: Male    Birth control/protection: Surgical  Other Topics Concern   Not on file  Social History Narrative   Not on file   Social Determinants of Health   Financial Resource Strain:    Difficulty of Paying Living Expenses:   Food Insecurity:    Worried About Charity fundraiser in the Last Year:    Arboriculturist in the Last Year:   Transportation Needs:    Film/video editor (Medical):    Lack of Transportation (Non-Medical):   Physical Activity:    Days of Exercise per Week:    Minutes of Exercise per Session:   Stress:  Feeling of Stress :   Social Connections:    Frequency of Communication with Friends and Family:    Frequency of Social Gatherings with Friends and Family:    Attends Religious Services:    Active Member of Clubs or Organizations:    Attends Music therapist:    Marital Status:   Intimate Partner Violence:    Fear of Current or Ex-Partner:    Emotionally Abused:    Physically Abused:    Sexually Abused:     Family History  Problem Relation Age of Onset   Colon cancer Father    Colon cancer Paternal Grandmother     The following portions of the patient's history were reviewed and updated as appropriate: allergies, current medications, past family history, past medical history, past social history, past surgical history and problem list.  Review of Systems Pertinent  items are noted in HPI.   Objective:  BP 116/75    Pulse 77    Ht 5\' 3"  (1.6 m)    Wt 120 lb (54.4 kg)    LMP 10/29/2019    BMI 21.26 kg/m  CONSTITUTIONAL: Well-developed, well-nourished female in no acute distress.  HENT:  Normocephalic, atraumatic, External right and left ear normal. Oropharynx is clear and moist EYES: Conjunctivae and EOM are normal. Pupils are equal, round, and reactive to light. No scleral icterus.  NECK: Normal range of motion, supple, no masses.  Normal thyroid.  SKIN: Skin is warm and dry. No rash noted. Not diaphoretic. No erythema. No pallor. NEUROLOGIC: Alert and oriented to person, place, and time. Normal reflexes, muscle tone coordination. No cranial nerve deficit noted. PSYCHIATRIC: Normal mood and affect. Normal behavior. Normal judgment and thought content. CARDIOVASCULAR: Normal heart rate noted RESPIRATORY: Effort normal, no problems with respiration noted. BREASTS: Symmetric in size. No masses, skin changes, nipple drainage, or lymphadenopathy. ABDOMEN: Soft, no distention noted.  No tenderness, rebound or guarding.  PELVIC: Normal appearing external genitalia; normal appearing vaginal mucosa and cervix.  No abnormal discharge noted.  Pap smear obtained. EMB done, see note. Normal uterine size, no other palpable masses, no uterine or adnexal tenderness. MUSCULOSKELETAL: Normal range of motion. No tenderness.  No cyanosis, clubbing, or edema.  2+ distal pulses.  Exam done with chaperone present.  Assessment and Plan:   1. Abnormal uterine bleeding EMB to rule out polyp See note - POCT urine pregnancy - Surgical pathology  2. Well woman exam with routine gynecological exam - Cytology - PAP( [place])  3. Cervical cancer screening  4. Encounter for screening mammogram for malignant neoplasm of breast - MM 3D SCREEN BREAST BILATERAL; Future  5. Family history of colon cancer in father - Ambulatory referral to Gastroenterology   Will follow  up results of pap smear/EMB screen and manage accordingly. Encouraged improvement in diet and exercise.  Mammogram ordered today Referral for colonoscopy today  Routine preventative health maintenance measures emphasized. Please refer to After Visit Summary for other counseling recommendations.   Total face-to-face time with patient: 30 minutes. Over 50% of encounter was spent on counseling and coordination of care.   Feliz Beam, M.D. Attending Center for [employer] Fish farm manager)

## 2019-11-12 NOTE — Progress Notes (Signed)
ENDOMETRIAL BIOPSY      Cindy Bradley is a 39 y.o. [id number] here for endometrial biopsy.  The indications for endometrial biopsy were reviewed.  Risks of the biopsy including cramping, bleeding, infection, uterine perforation, inadequate specimen and need for additional procedures were discussed. The patient states she understands and agrees to undergo procedure today. Consent was signed. Time out was performed.   Indications: intermenstrual bleeding Urine HCG: negative  A bivalve speculum was placed into the vagina and the cervix was easily visualized and was prepped with Betadine x2. A single-toothed tenaculum was placed on the anterior lip of the cervix to stabilize it. The 3 mm pipelle was introduced into the endometrial cavity without difficulty to a depth of 9 cm, and a moderate amount of tissue was obtained and sent to pathology. This was repeated for a total of 3 passes. The instruments were removed from the patient's vagina. Minimal bleeding from the cervix at the tenaculum was noted.   The patient tolerated the procedure well. Routine post-procedure instructions were given to the patient.    Will base further management on results of biopsy.  Feliz Beam, M.D. Attending Center for [employer] Fish farm manager)

## 2019-11-13 LAB — CYTOLOGY - PAP
Comment: NEGATIVE
Diagnosis: NEGATIVE
High risk HPV: NEGATIVE

## 2019-12-24 ENCOUNTER — Ambulatory Visit: Payer: 59 | Admitting: Obstetrics and Gynecology

## 2019-12-24 ENCOUNTER — Encounter: Payer: Self-pay | Admitting: Obstetrics and Gynecology

## 2019-12-24 ENCOUNTER — Other Ambulatory Visit: Payer: Self-pay

## 2019-12-24 VITALS — BP 101/62 | HR 86 | Resp 16 | Ht 63.0 in | Wt 118.0 lb

## 2019-12-24 DIAGNOSIS — N840 Polyp of corpus uteri: Secondary | ICD-10-CM | POA: Diagnosis not present

## 2019-12-24 DIAGNOSIS — Z23 Encounter for immunization: Secondary | ICD-10-CM | POA: Diagnosis not present

## 2019-12-24 NOTE — Progress Notes (Signed)
   GYNECOLOGY OFFICE FOLLOW UP NOTE  History:  39 y.o. [id number] here today for follow up for results. Pt has history of spotting in between periods and dark brown discharge at end of periods. No new complaints.    Past Medical History:  Diagnosis Date  . Abnormal Pap smear of cervix 2007  . Anemia   . 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 SVD (spontaneous vaginal delivery)    x 1 at 23 wks - late term termination in [place]    Past Surgical History:  Procedure Laterality Date  . CESAREAN SECTION  09/05/2011  . CESAREAN SECTION  09/14/2013  . LAPAROSCOPIC BILATERAL SALPINGECTOMY Bilateral 09/07/2017   Procedure: LAPAROSCOPIC BILATERAL SALPINGECTOMY;  Surgeon: Emily Filbert, MD;  Location: [facility];  Service: Gynecology;  Laterality: Bilateral;  . LEEP  2007  . WISDOM TOOTH EXTRACTION       Current Outpatient Medications:  .  ibuprofen (ADVIL,MOTRIN) 600 MG tablet, Take 1 tablet (600 mg total) by mouth every 6 (six) hours as needed. (Patient not taking: Reported on 09/14/2017), Disp: 30 tablet, Rfl: 1  The following portions of the patient's history were reviewed and updated as appropriate: allergies, current medications, past family history, past medical history, past social history, past surgical history and problem list.   Review of Systems:  Pertinent items noted in HPI and remainder of comprehensive ROS otherwise negative.   Objective:  Physical Exam BP 101/62   Pulse 86   Resp 16   Ht 5\' 3"  (1.6 m)   Wt 118 lb (53.5 kg)   BMI 20.90 kg/m  CONSTITUTIONAL: Well-developed, well-nourished female in no acute distress.  HENT:  Normocephalic, atraumatic. External right and left ear normal. Oropharynx is clear and moist EYES: Conjunctivae and EOM are normal. Pupils are equal, round, and reactive to light. No scleral icterus.  NECK: Normal range of motion, supple, no masses SKIN: Skin is warm and dry. No rash noted. Not diaphoretic. No erythema. No pallor. NEUROLOGIC:  Alert and oriented to person, place, and time. Normal reflexes, muscle tone coordination. No cranial nerve deficit noted. PSYCHIATRIC: Normal mood and affect. Normal behavior. Normal judgment and thought content. CARDIOVASCULAR: Normal heart rate noted RESPIRATORY: Effort normal, no problems with respiration noted ABDOMEN: no distention noted.   PELVIC: deferred MUSCULOSKELETAL: Normal range of motion. No edema noted.  Labs and Imaging No results found.  Assessment & Plan:  1. Needs flu shot - Flu Vaccine QUAD 36+ mos IM  2. Endometrial polyp - Reviewed findings on EMB, benign results but possible polyp, reviewed that polyps should be removed if they are bothersome to patient and generally removed if pt is menopausal, reviewed SHG for further exploration of uterine cavity if needed, reviewed D&C hysteroscopy for polypectomy - pt reports symptoms are not bothersome to her at this point and declines removal, will monitor closely and return if spotting/discharge becomes an issue  Routine preventative health maintenance measures emphasized. Please refer to After Visit Summary for other counseling recommendations.   Return in about 1 year (around 12/23/2020), or if symptoms worsen or fail to improve, for no follow up needed at this time.  Total face-to-face time with patient: 12 minutes. Over 50% of encounter was spent on counseling and coordination of care.  Feliz Beam, M.D. Attending Center for [employer] Fish farm manager)

## 2020-10-02 IMAGING — US TRANSVAGINAL ULTRASOUND OF PELVIS
1 series · 13 of 25 positions shown · non-contrast
Comparison: None.

CLINICAL DATA: Initial evaluation for intermittent vaginal
spotting.

EXAM:
ULTRASOUND PELVIS TRANSVAGINAL
TECHNIQUE: Transvaginal ultrasound examination of the pelvis was performed
including evaluation of the uterus, ovaries, adnexal regions, and
pelvic cul-de-sac.

[Series 1: transvaginal ultrasound of pelvis · 0.09mm/px · 13 of 100 slices shown]
[im 1/100]
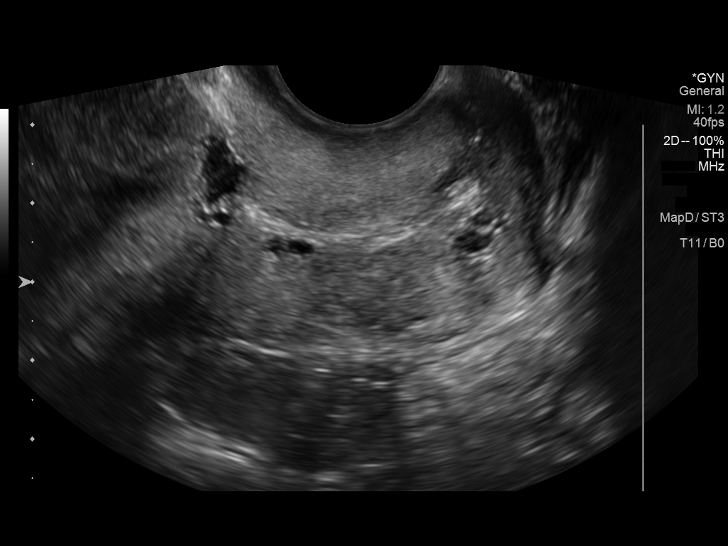
[im 9/100]
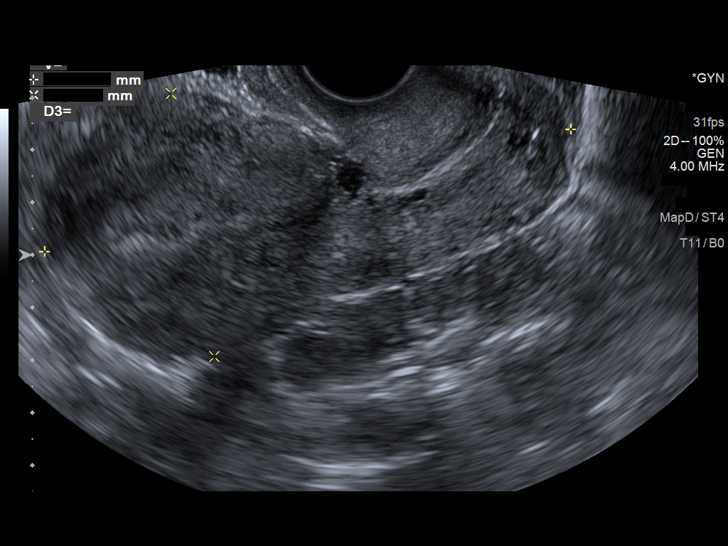
[im 17/100]
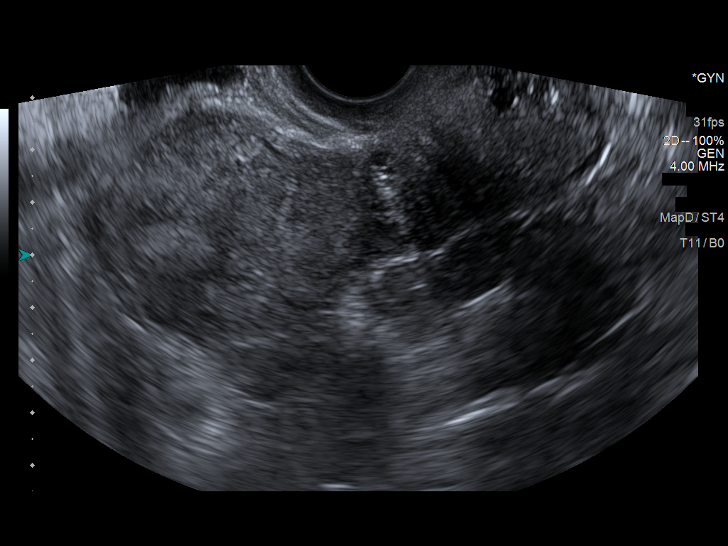
[im 25/100]
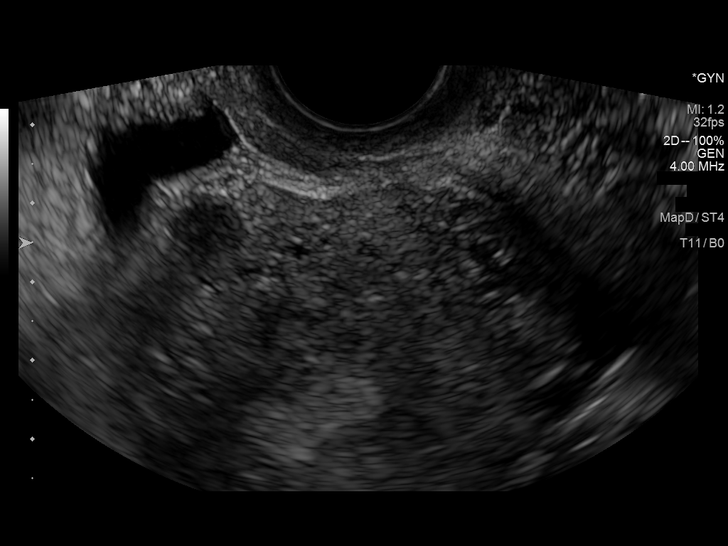
[im 34/100]
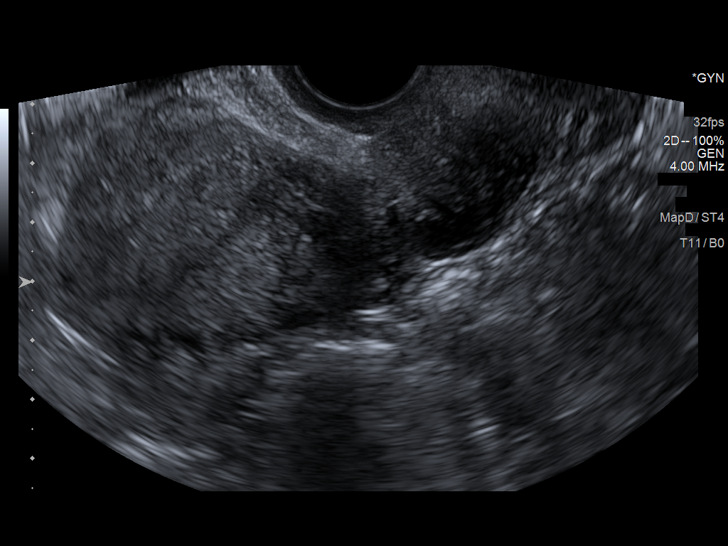
[im 42/100]
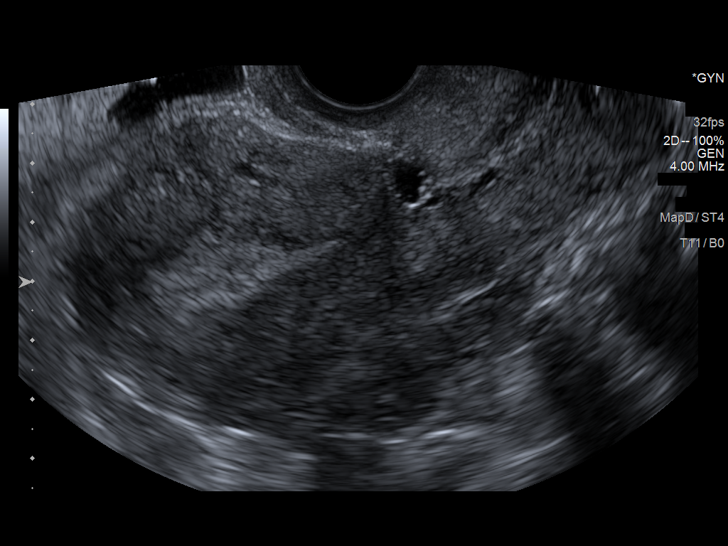
[im 50/100]
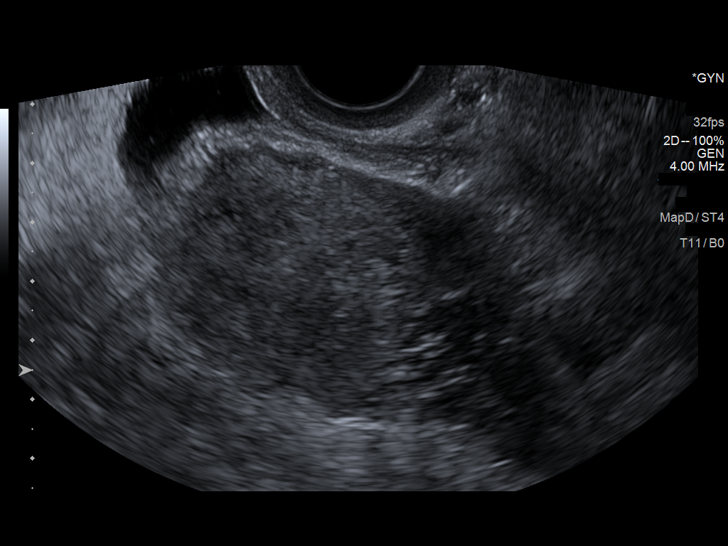
[im 58/100]
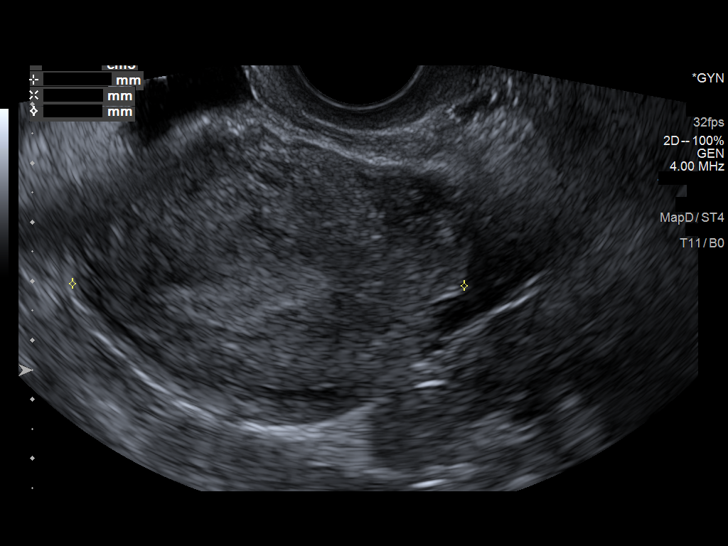
[im 67/100]
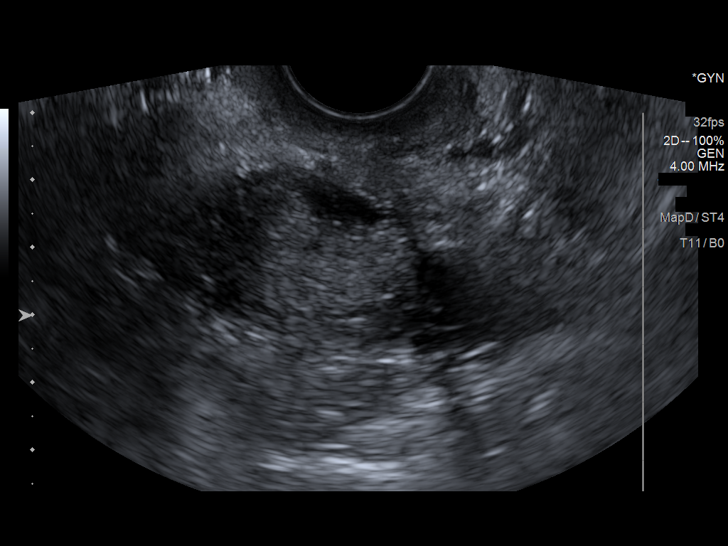
[im 75/100]
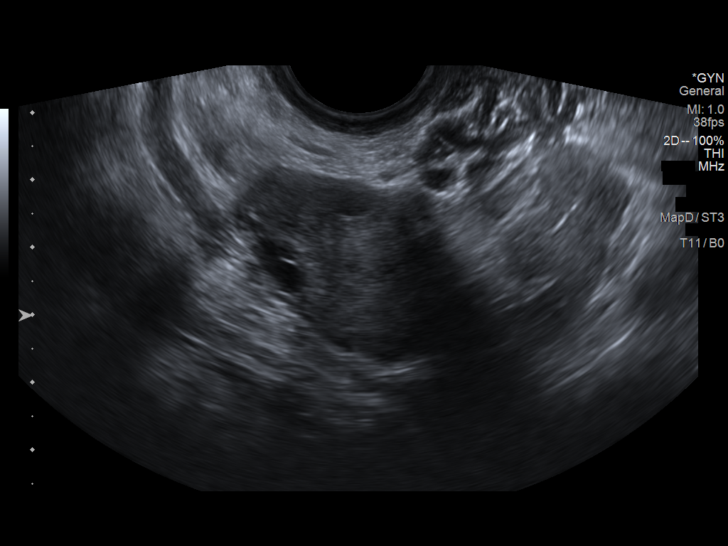
[im 83/100]
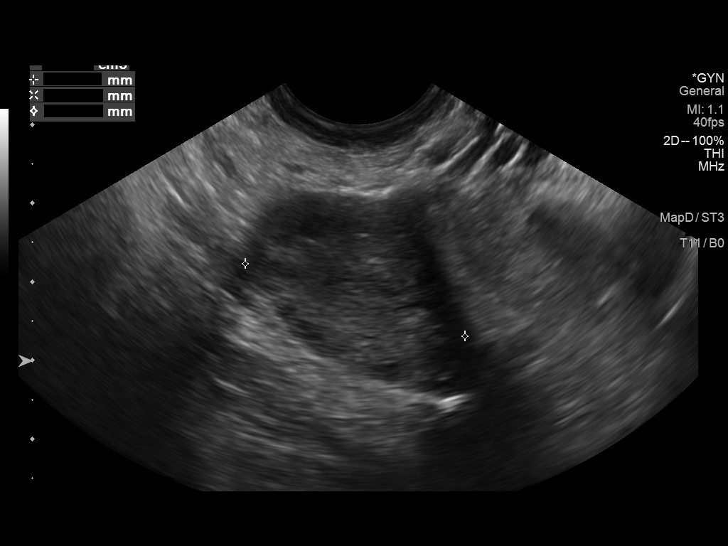
[im 91/100]
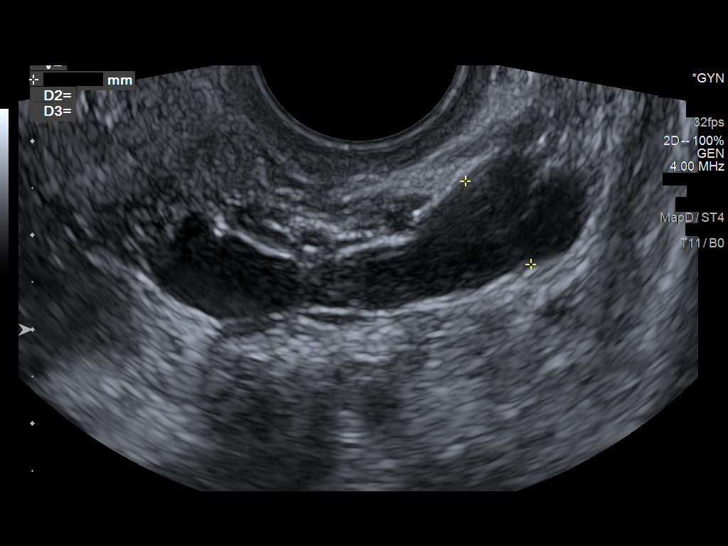
[im 100/100]
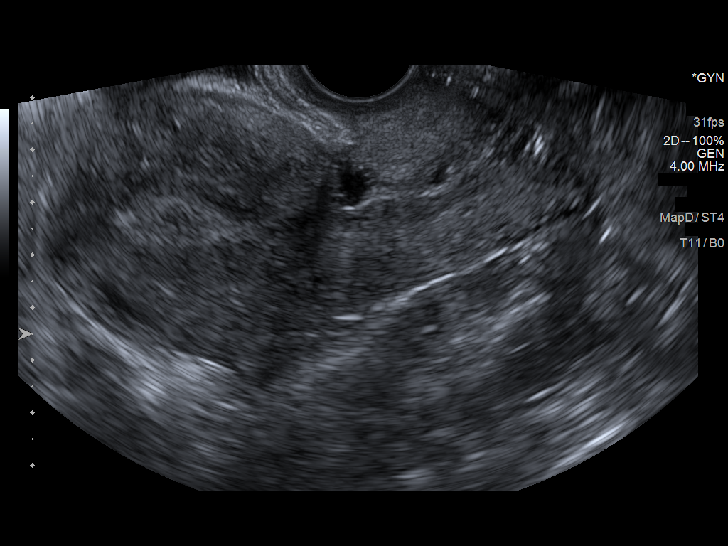

[13 of 25 positions shown; findings below may reference images not displayed]

FINDINGS: Uterus

Measurements: 10.3 x 5.1 x 6.6 cm = volume: 181 mL. 7 x 8 x 10 mm
subserosal fibroid present at the anterior uterine fundus.

Endometrium

Thickness: 15 mm.  No focal abnormality visualized.

Right ovary

Measurements: 3.8 x 2.4 x 2.9 cm = volume: 14 mL. Normal
appearance/no adnexal mass.

Left ovary

Left visualized. No adnexal mass. Prominent vein measuring 11 mm in
diameter noted within the left adnexa.

Other findings:  No abnormal free fluid
IMPRESSION: 1. Endometrial stripe measures 13 mm in thickness. If bleeding
remains unresponsive to hormonal or medical therapy, sonohysterogram
should be considered for focal lesion work-up. (Ref: Radiological
Reasoning: Algorithmic Workup of Abnormal Vaginal Bleeding with
Endovaginal Sonography and Sonohysterography. AJR [id number]; 191:S68-73).
2. 1 cm subserosal fibroid at the uterine fundus.
3. Nonvisualization left ovary.  No adnexal mass or free fluid.
4. Normal right ovary.

## 2020-11-20 ENCOUNTER — Ambulatory Visit (INDEPENDENT_AMBULATORY_CARE_PROVIDER_SITE_OTHER): Payer: 59 | Admitting: Obstetrics and Gynecology

## 2020-11-20 ENCOUNTER — Other Ambulatory Visit: Payer: Self-pay

## 2020-11-20 ENCOUNTER — Encounter: Payer: Self-pay | Admitting: Obstetrics and Gynecology

## 2020-11-20 VITALS — BP 106/66 | HR 77 | Resp 16 | Ht 63.0 in | Wt 118.0 lb

## 2020-11-20 DIAGNOSIS — Z800 Family history of malignant neoplasm of digestive organs: Secondary | ICD-10-CM | POA: Diagnosis not present

## 2020-11-20 DIAGNOSIS — Z01419 Encounter for gynecological examination (general) (routine) without abnormal findings: Secondary | ICD-10-CM | POA: Diagnosis not present

## 2020-11-20 DIAGNOSIS — N939 Abnormal uterine and vaginal bleeding, unspecified: Secondary | ICD-10-CM

## 2020-11-20 DIAGNOSIS — K649 Unspecified hemorrhoids: Secondary | ICD-10-CM

## 2020-11-20 NOTE — Progress Notes (Signed)
GYNECOLOGY ANNUAL PREVENTATIVE CARE ENCOUNTER NOTE  Subjective:   Cindy Bradley is a 40 y.o. G88P2012 female here for a annual gynecologic exam. Current complaints: none new.  Still has heavy periods consistent with what they have been, they are regular. Still with spotting in between periods.   Denies abnormal vaginal, discharge, pelvic pain, problems with intercourse or other gynecologic concerns. Declines STI screen.   Gynecologic History Patient's last menstrual period was 11/14/2020. Contraception: tubal ligation Last Pap: 10/2019. Results: normal Last mammogram: 2022. Results: normal per patient DEXA: has never had  Obstetric History OB History  Gravida Para Term Preterm AB Living  '3 2 2   1 2  '$ SAB IAB Ectopic Multiple Live Births    1     2    # Outcome Date GA Lbr Len/2nd Weight Sex Delivery Anes PTL Lv  3 Term 09/14/13 [redacted]w[redacted]d  M CS-LTranv   LIV  2 Term 09/05/11 [redacted]w[redacted]d M CS-LTranv   LIV  1 IAB 07/22/10 [id number]         Complications: Fetal anomaly necessitating delivery    Past Medical History:  Diagnosis Date   Abnormal Pap smear of cervix 2007   Anemia    [id number]   lower back - melanomia   SVD (spontaneous vaginal delivery)    x 1 at 23 wks - late term termination in [place] Past Surgical History:  Procedure Laterality Date   CESAREAN SECTION  09/05/2011   CESAREAN SECTION  09/14/2013   LAPAROSCOPIC BILATERAL SALPINGECTOMY Bilateral 09/07/2017   Procedure: LAPAROSCOPIC BILATERAL SALPINGECTOMY;  Surgeon: DovEmily FilbertD;  [facility];  Service: Gynecology;  Laterality: Bilateral;   LEEP  2007   WISDOM TOOTH EXTRACTION      No current outpatient medications on file prior to visit.   No current facility-administered medications on file prior to visit.    Allergies  Allergen Reactions   Latex Itching    Latex condoms     Social History   Socioeconomic History   Marital status: Married    Spouse name: Not on file   Number of  children: Not on file   Years of education: Not on file   Highest education level: Not on file  Occupational History   Not on file  Tobacco Use   Smoking status: Never   Smokeless tobacco: Never  Vaping Use   Vaping Use: Never used  Substance and Sexual Activity   Alcohol use: Yes    Comment: occasional   Drug use: No   Sexual activity: Yes    Partners: Male    Birth control/protection: Surgical  Other Topics Concern   Not on file  Social History Narrative   Not on file   Social Determinants of Health   Financial Resource Strain: Not on file  Food Insecurity: Not on file  Transportation Needs: Not on file  Physical Activity: Not on file  Stress: Not on file  Social Connections: Not on file  Intimate Partner Violence: Not on file    Family History  Problem Relation Age of Onset   Colon cancer Father    Colon cancer Paternal Grandmother    The following portions of the patient's history were reviewed and updated as appropriate: allergies, current medications, past family history, past medical history, past social history, past surgical history and problem list.  Review of Systems Pertinent items are noted in HPI.   Objective:  BP 106/66   Pulse 77   Resp 16   Ht '5\' 3"'$  (1.6 m)   Wt 118 lb (53.5 kg)   LMP 11/14/2020   BMI 20.90 kg/m  CONSTITUTIONAL: Well-developed, well-nourished female in no acute distress.  HENT:  Normocephalic, atraumatic, External right and left ear normal. Oropharynx is clear and moist EYES: Conjunctivae and EOM are normal. Pupils are equal, round, and reactive to light. No scleral icterus.  NECK: Normal range of motion, supple, no masses.  Normal thyroid.  SKIN: Skin is warm and dry. No rash noted. Not diaphoretic. No erythema. No pallor. NEUROLOGIC: Alert and oriented to person, place, and time. Normal reflexes, muscle tone coordination. No cranial nerve deficit noted. PSYCHIATRIC: Normal mood and affect. Normal behavior. Normal judgment  and thought content. CARDIOVASCULAR: Normal heart rate noted RESPIRATORY: Effort normal, no problems with respiration noted. BREASTS: Symmetric in size. No masses, skin changes, nipple drainage, or lymphadenopathy. ABDOMEN: Soft, no distention noted.  No tenderness, rebound or guarding.  PELVIC: Normal appearing external genitalia; normal appearing vaginal mucosa and cervix.  No abnormal discharge noted.  Normal uterine size, no other palpable masses, no uterine or adnexal tenderness. MUSCULOSKELETAL: Normal range of motion. No tenderness.  No cyanosis, clubbing, or edema.  2+ distal pulses.  Exam done with chaperone present.    Assessment and Plan:   1. Well woman exam Healthy female exam  2. Abnormal uterine bleeding (AUB) Likely 2/2 possible polyp (noted on prior EMB to have fragments suggestive of polyp) - patient previously declined D&C hysteroscopy - reviewed options again today, small risk of polyp progressing to malignancy in pre-menopausal woman, higher risk in post menopausal woman - reviewed that is spotting is not bothersome, likely 2/2 polyp but cannot definitively say it is due to polyp unless visualized and removed, will likely grow and possibly worsen symptoms, turn malignant - she would prefer to watch and wait for now as spotting/discharge not bothersome - will consider repeating EMB if still having same symptoms   3. Family history of colon cancer in father Referral to GI for screening colonoscopy  4. Hemorrhoids, unspecified hemorrhoid type - Ambulatory referral to Gastroenterology   Declines STI screen. Mammogram UTD Referral for colonoscopy today Flu vaccine n/a DEXA not due based on age  Routine preventative health maintenance measures emphasized. Please refer to After Visit Summary for other counseling recommendations.    Feliz Beam, MD, [place] for [facility])

## 2020-11-20 NOTE — Progress Notes (Signed)
Last mam- 08/14/2020- normal Last pap- 11/11/20- negative

## 2021-11-24 NOTE — Progress Notes (Signed)
Last Mammogram: 08/14/20- negative Last Pap Smear:  11/12/19- negative Last Colon Screening: Has not completed, moving to [facility].  Seat Belts:   Yes Sun Screen:   Yes Dental Check Up:  Yes Brush & Floss:  Yes

## 2021-11-30 ENCOUNTER — Ambulatory Visit (INDEPENDENT_AMBULATORY_CARE_PROVIDER_SITE_OTHER): Payer: 59 | Admitting: Obstetrics & Gynecology

## 2021-11-30 ENCOUNTER — Encounter: Payer: Self-pay | Admitting: Obstetrics & Gynecology

## 2021-11-30 VITALS — BP 110/72 | HR 76 | Ht 63.0 in | Wt 119.0 lb

## 2021-11-30 DIAGNOSIS — Z01419 Encounter for gynecological examination (general) (routine) without abnormal findings: Secondary | ICD-10-CM | POA: Diagnosis not present

## 2021-11-30 DIAGNOSIS — Z9851 Tubal ligation status: Secondary | ICD-10-CM | POA: Diagnosis not present

## 2021-11-30 DIAGNOSIS — N923 Ovulation bleeding: Secondary | ICD-10-CM

## 2021-11-30 MED ORDER — NORETHIN ACE-ETH ESTRAD-FE 1-20 MG-MCG(24) PO TABS: 1.0000 | ORAL_TABLET | Freq: Every day | ORAL | 11 refills | Status: AC

## 2021-11-30 NOTE — Progress Notes (Signed)
Subjective:     Cindy Bradley is a 41 y.o. female here for a routine exam.  Current complaints: Spotting between menstrual cycles.  Patient also was recently pregnant.  She has had a bilateral partial salpingectomy by Dr. Hulan Fray.  Pathology shows complete transection of tubes.  Patient was pregnant over in [place].  She had an ultrasound that confirmed the pregnancy.  She did have a miscarriage.  She does not desire to become pregnant.  She is moving to [facility] this week..    Gynecologic History Patient's last menstrual period was 11/25/2021 (exact date). Contraception: tubal ligation patient had a pregnancy earlier this year that ended in miscarriage. Last Mammogram: 08/14/20- negative Last Pap Smear:  11/12/19- negative Last Colon Screening: Has not completed, moving to [facility].  Seat Belts:   Yes Sun Screen:   Yes Dental Check Up:  Yes Brush & Floss:  Yes   Obstetric History OB History  Gravida Para Term Preterm AB Living  '3 2 2   1 2  '$ SAB IAB Ectopic Multiple Live Births    1     2    # Outcome Date GA Lbr Len/2nd Weight Sex Delivery Anes PTL Lv  3 Term 09/14/13 [redacted]w[redacted]d  M CS-LTranv   LIV  2 Term 09/05/11 373w4d M CS-LTranv   LIV  1 IAB 07/22/10 [id number]         Complications: Fetal anomaly necessitating delivery     The following portions of the patient's history were reviewed and updated as appropriate: allergies, current medications, past family history, past medical history, past social history, past surgical history, and problem list.  Review of Systems Pertinent items noted in HPI and remainder of comprehensive ROS otherwise negative.    Objective:     Vitals:   11/30/21 0810  BP: 110/72  Pulse: 76  Weight: 119 lb (54 kg)  Height: '5\' 3"'$  (1.6 m)   Vitals:  WNL General appearance: alert, cooperative and no distress  HEENT: Normocephalic, without obvious abnormality, atraumatic Eyes: negative Throat: lips, mucosa, and tongue normal; teeth and gums normal   Respiratory: Clear to auscultation bilaterally  CV: Regular rate and rhythm  Breasts:  Normal appearance, no masses or tenderness, no nipple retraction or dimpling  GI: Soft, non-tender; bowel sounds normal; no masses,  no organomegaly  GU: External Genitalia:  Tanner V, no lesion Urethra:  No prolapse   Vagina: Pink, normal rugae, no blood or discharge  Cervix: No CMT, no lesion  Uterus:  Normal size and contour, non tender  Adnexa: Normal, no masses, non tender  Musculoskeletal: No edema, redness or tenderness in the calves or thighs  Skin: No lesions or rash  Lymphatic: Axillary adenopathy: none     Psychiatric: Normal mood and behavior        Assessment:    Healthy female exam.  Intermenstrual spotting   Plan:   Saline sono to evaluate spotting and possbile polyp on biopsy done in 2021 [facility] as pt is fertile ([place]family his of colon cancer Pap is up to date

## 2021-12-07 ENCOUNTER — Encounter: Payer: Self-pay | Admitting: Obstetrics & Gynecology

## 2022-02-11 ENCOUNTER — Ambulatory Visit: Payer: 59
